# Patient Record
Sex: Female | Born: 1990 | Race: White | Hispanic: No | Marital: Single
Health system: Southern US, Community
[De-identification: ages and names within clinical notes are randomized; demographics above are authoritative.]

---
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
## 2015-06-27 ENCOUNTER — Telehealth: Payer: Self-pay | Admitting: Family Medicine

## 2015-06-27 NOTE — Telephone Encounter (Signed)
Printed off shot record from [facility] and mailed to patient/lc
